# Patient Record
Sex: Female | Born: 2005
Health system: Southern US, Community
[De-identification: ages and names within clinical notes are randomized; demographics above are authoritative.]

## PROBLEM LIST (undated history)

## (undated) DIAGNOSIS — R569 Unspecified convulsions: Secondary | ICD-10-CM

---
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
## 2023-01-15 NOTE — Progress Notes (Signed)
  Victoria [name] [address] [name] Phone: [phone] Subjective:   Victoria [name], am serving as a scribe for Dr. Hulan Saas.  I'm seeing this patient by the request  of:  Chesley Noon, MD  CC: Back pain and headache follow-up  [id number]  Victoria [name] is a 17 y.o. female coming in with complaint of back and neck pain. OMT on 12/04/2022. Patient states doing well. Tension migraines are getting better. Everything else is the same. No new concerns.  Medications patient has been prescribed:   Taking:         Reviewed prior external information including notes and imaging from previsou exam, outside providers and external EMR if available.   As well as notes that were available from care everywhere and other healthcare systems.  Past medical history, social, surgical and family history all reviewed in electronic medical record.  No pertanent information unless stated regarding to the chief complaint.   Past Medical History:  Diagnosis Date   Seizures ([place])     Allergies  Allergen Reactions   Effexor Xr [Venlafaxine Hcl] Anxiety    Began with increased agitation and anxiety after two doses.      Review of Systems:  No  visual changes, nausea, vomiting, diarrhea, constipation, dizziness, abdominal pain, skin rash, fevers, chills, night sweats, weight loss, swollen lymph nodes, body aches, joint swelling, chest pain, shortness of breath, mood changes. POSITIVE muscle aches, headache  Objective  Blood pressure 110/62, pulse 100, height 5\' 5"  (1.651 m), weight 117 lb (53.1 kg), SpO2 98 %.   General: No apparent distress alert and oriented x3 mood and affect normal, dressed appropriately.  HEENT: Pupils equal, extraocular movements intact  Respiratory: Patient's speak in full sentences and does not appear short of breath  Cardiovascular: No lower extremity edema, non tender, no erythema  Neck exam does have some  mild loss of lordosis.  Some tenderness to palpation in the paraspinal musculature.  Tightness with sidebending bilaterally.  Very mild scoliosis noted.  Osteopathic findings  C2 flexed rotated and side bent right C7 flexed rotated and side bent left T7 extended rotated and side bent left L1 flexed rotated and side bent right Sacrum right on right       Assessment and Plan:  Scoliosis Overall doing very well.  Has had some more tension headaches recently.  This is doing better.  Discussed posture and ergonomics, discussed which activities to do History of 1.  Increase activity slowly.  Follow-up again in 6 to 8 weeks    Nonallopathic problems  Decision today to treat with OMT was based on Physical Exam  After verbal consent patient was treated with HVLA, ME, FPR techniques in cervical, thoracic, lumbar, and sacral  areas  Patient tolerated the procedure well with improvement in symptoms  Patient given exercises, stretches and lifestyle modifications  See medications in patient instructions if given  Patient will follow up in 4-8 weeks    The above documentation has been reviewed and is accurate and complete Lyndal Pulley, DO          Note: This dictation was prepared with Dragon dictation along with smaller phrase technology. Any transcriptional errors that result from this process are unintentional.

## 2023-01-22 ENCOUNTER — Ambulatory Visit (INDEPENDENT_AMBULATORY_CARE_PROVIDER_SITE_OTHER): Payer: 59 | Admitting: Family Medicine

## 2023-01-22 ENCOUNTER — Encounter: Payer: Self-pay | Admitting: Family Medicine

## 2023-01-22 VITALS — BP 110/62 | HR 100 | Ht 65.0 in | Wt 117.0 lb

## 2023-01-22 DIAGNOSIS — M4126 Other idiopathic scoliosis, lumbar region: Secondary | ICD-10-CM | POA: Diagnosis not present

## 2023-01-22 DIAGNOSIS — M9901 Segmental and somatic dysfunction of cervical region: Secondary | ICD-10-CM | POA: Diagnosis not present

## 2023-01-22 DIAGNOSIS — M9904 Segmental and somatic dysfunction of sacral region: Secondary | ICD-10-CM

## 2023-01-22 DIAGNOSIS — M9902 Segmental and somatic dysfunction of thoracic region: Secondary | ICD-10-CM

## 2023-01-22 DIAGNOSIS — M9903 Segmental and somatic dysfunction of lumbar region: Secondary | ICD-10-CM

## 2023-01-22 NOTE — Assessment & Plan Note (Signed)
Overall doing very well.  Has had some more tension headaches recently.  This is doing better.  Discussed posture and ergonomics, discussed which activities to do History of 1.  Increase activity slowly.  Follow-up again in 6 to 8 weeks

## 2023-01-22 NOTE — Patient Instructions (Signed)
Good to see you! Can try Voltaren gel topically See you again in 2 months

## 2023-03-27 ENCOUNTER — Ambulatory Visit: Payer: 59 | Admitting: Family Medicine

## 2023-04-04 NOTE — Progress Notes (Signed)
  [name] [address] [place] [address] Phone: [phone] Subjective:   Bruce Donath, am serving as a scribe for Dr. Antoine Primas.  I'm seeing this patient by the request  of:  Eartha Inch, MD  CC: neck and back pain   [id number]  Victoria [name] is a 17 y.o. female coming in with complaint of back and neck pain. OMT on 01/22/2023. Patient states that she is doing well. No issues since last visit.  Recently was [place] a amusement park and had no significant difficulty.         Reviewed prior external information including notes and imaging from previsou exam, outside providers and external EMR if available.   As well as notes that were available from care everywhere and other healthcare systems.  Past medical history, social, surgical and family history all reviewed in electronic medical record.  No pertanent information unless stated regarding to the chief complaint.   Past Medical History:  Diagnosis Date   Seizures (HCC)     Allergies  Allergen Reactions   Effexor Xr [Venlafaxine Hcl] Anxiety    Began with increased agitation and anxiety after two doses.      Review of Systems:  No headache, visual changes, nausea, vomiting, diarrhea, constipation, dizziness, abdominal pain, skin rash, fevers, chills, night sweats, weight loss, swollen lymph nodes, body aches, joint swelling, chest pain, shortness of breath, mood changes. POSITIVE muscle aches  Objective  Blood pressure 118/72, pulse (!) 116, height 5\' 5"  (1.651 m), weight 113 lb (51.3 kg), SpO2 98 %.   General: No apparent distress alert and oriented x3 mood and affect normal, dressed appropriately.  HEENT: Pupils equal, extraocular movements intact  Respiratory: Patient's speak in full sentences and does not appear short of breath  Cardiovascular: No lower extremity edema, non tender, no erythema  Mild scoliosis noted.  Nontender on exam low.  Good range of  motion.  Actually some mild hypermobility noted.  Osteopathic findings  C5 flexed rotated and side bent left T3 extended rotated and side bent right inhaled rib T5 extended rotated and side bent left L1 flexed rotated and side bent right L5 flexed rotated and side bent left Sacrum right on right    Assessment and Plan:  Scoliosis Responding extremely well though to osteopathic manipulation.  The patient now has significant Tanner stage V and the scoliosis will not change any more.  Feeling good at the moment and not carrying any of the heavy bags.  Will be going to college locally.  Discussed some other core strengthening and muscle strengthening noted.  Follow-up again in 12 weeks    Nonallopathic problems  Decision today to treat with OMT was based on Physical Exam  After verbal consent patient was treated with HVLA, ME, FPR techniques in cervical, rib, thoracic, lumbar, and sacral  areas  Patient tolerated the procedure well with improvement in symptoms  Patient given exercises, stretches and lifestyle modifications  See medications in patient instructions if given  Patient will follow up in 12weeks     The above documentation has been reviewed and is accurate and complete Judi Saa, DO         Note: This dictation was prepared with Dragon dictation along with smaller phrase technology. Any transcriptional errors that result from this process are unintentional.

## 2023-04-05 ENCOUNTER — Encounter: Payer: Self-pay | Admitting: Family Medicine

## 2023-04-05 ENCOUNTER — Ambulatory Visit (INDEPENDENT_AMBULATORY_CARE_PROVIDER_SITE_OTHER): Payer: 59 | Admitting: Family Medicine

## 2023-04-05 VITALS — BP 118/72 | HR 116 | Ht 65.0 in | Wt 113.0 lb

## 2023-04-05 DIAGNOSIS — M9903 Segmental and somatic dysfunction of lumbar region: Secondary | ICD-10-CM | POA: Diagnosis not present

## 2023-04-05 DIAGNOSIS — M4126 Other idiopathic scoliosis, lumbar region: Secondary | ICD-10-CM | POA: Diagnosis not present

## 2023-04-05 DIAGNOSIS — M9904 Segmental and somatic dysfunction of sacral region: Secondary | ICD-10-CM | POA: Diagnosis not present

## 2023-04-05 DIAGNOSIS — M9908 Segmental and somatic dysfunction of rib cage: Secondary | ICD-10-CM

## 2023-04-05 DIAGNOSIS — M9901 Segmental and somatic dysfunction of cervical region: Secondary | ICD-10-CM

## 2023-04-05 DIAGNOSIS — M9902 Segmental and somatic dysfunction of thoracic region: Secondary | ICD-10-CM | POA: Diagnosis not present

## 2023-04-05 NOTE — Assessment & Plan Note (Signed)
Responding extremely well though to osteopathic manipulation.  The patient now has significant Tanner stage V and the scoliosis will not change any more.  Feeling good at the moment and not carrying any of the heavy bags.  Will be going to college locally.  Discussed some other core strengthening and muscle strengthening noted.  Follow-up again in 12 weeks

## 2023-04-05 NOTE — Patient Instructions (Signed)
Good to see you Glad roller coaster didn't do too much damage See me in 2 months

## 2023-05-30 NOTE — Progress Notes (Signed)
  [name] [address] [place] [address] Phone: [phone] Subjective:   Victoria [name], am serving as a scribe for Dr. Antoine Primas.  I'm seeing this patient by the request  of:  Eartha Inch, MD  CC: Back and neck pain  [id number]  Victoria [name] is a 17 y.o. female coming in with complaint of back and neck pain. OMT on 04/05/2023. Patient states doing well. Having some headaches lately. Same per usual. No other concerns.        Reviewed prior external information including notes and imaging from previsou exam, outside providers and external EMR if available.   As well as notes that were available from care everywhere and other healthcare systems.  Past medical history, social, surgical and family history all reviewed in electronic medical record.  No pertanent information unless stated regarding to the chief complaint.   Past Medical History:  Diagnosis Date   Seizures (HCC)     Allergies  Allergen Reactions   Effexor Xr [Venlafaxine Hcl] Anxiety    Began with increased agitation and anxiety after two doses.     Review of Systems:  No headache, visual changes, nausea, vomiting, diarrhea, constipation, dizziness, abdominal pain, skin rash, fevers, chills, night sweats, weight loss, swollen lymph nodes, body aches, joint swelling, chest pain, shortness of breath, mood changes. POSITIVE muscle aches  Objective  Blood pressure 110/64, pulse (!) 113, height 5\' 5"  (1.651 m), weight 113 lb (51.3 kg), SpO2 99%.   General: No apparent distress alert and oriented x3 mood and affect normal, dressed appropriately.  HEENT: Pupils equal, extraocular movements intact  Respiratory: Patient's speak in full sentences and does not appear short of breath  Cardiovascular: No lower extremity edema, non tender, no erythema  Low back exam shows patient does have some mild loss of lordosis.  Patient does have tightness noted in the paraspinal  musculature.  Osteopathic findings  C5 flexed rotated and side bent right T5 extended rotated and side bent right inhaled rib T7 extended rotated and side bent left L1 flexed rotated and side bent left  Sacrum right on right     Assessment and Plan:  Scoliosis Patient does have a Tanner stage V now at this time.  No significant other changes.  Patient is going to college next week.  Patient knows can follow-up with me again in 2 to 3 months if it is helpful    Nonallopathic problems  Decision today to treat with OMT was based on Physical Exam  After verbal consent patient was treated with HVLA, ME, FPR techniques in cervical, rib, thoracic, lumbar, and sacral  areas  Patient tolerated the procedure well with improvement in symptoms  Patient given exercises, stretches and lifestyle modifications  See medications in patient instructions if given  Patient will follow up in 4-8 weeks    The above documentation has been reviewed and is accurate and complete Judi Saa, DO          Note: This dictation was prepared with Dragon dictation along with smaller phrase technology. Any transcriptional errors that result from this process are unintentional.

## 2023-05-31 ENCOUNTER — Ambulatory Visit: Payer: 59 | Admitting: Family Medicine

## 2023-05-31 VITALS — BP 110/64 | HR 113 | Ht 65.0 in | Wt 113.0 lb

## 2023-05-31 DIAGNOSIS — M9901 Segmental and somatic dysfunction of cervical region: Secondary | ICD-10-CM

## 2023-05-31 DIAGNOSIS — M9908 Segmental and somatic dysfunction of rib cage: Secondary | ICD-10-CM

## 2023-05-31 DIAGNOSIS — M9903 Segmental and somatic dysfunction of lumbar region: Secondary | ICD-10-CM | POA: Diagnosis not present

## 2023-05-31 DIAGNOSIS — M9902 Segmental and somatic dysfunction of thoracic region: Secondary | ICD-10-CM

## 2023-05-31 DIAGNOSIS — M4126 Other idiopathic scoliosis, lumbar region: Secondary | ICD-10-CM | POA: Diagnosis not present

## 2023-05-31 DIAGNOSIS — M9904 Segmental and somatic dysfunction of sacral region: Secondary | ICD-10-CM

## 2023-05-31 NOTE — Assessment & Plan Note (Signed)
Patient does have a Tanner stage V now at this time.  No significant other changes.  Patient is going to college next week.  Patient knows can follow-up with me again in 2 to 3 months if it is helpful

## 2024-03-03 ENCOUNTER — Other Ambulatory Visit (HOSPITAL_BASED_OUTPATIENT_CLINIC_OR_DEPARTMENT_OTHER): Payer: Self-pay

## 2024-03-03 MED ORDER — FLUCONAZOLE 150 MG PO TABS
ORAL_TABLET | ORAL | 0 refills | Status: DC
  Filled 2024-03-03: qty 1, 1d supply, fill #0

## 2024-03-03 MED ORDER — AMOXICILLIN 875 MG PO TABS
875.0000 mg | ORAL_TABLET | Freq: Two times a day (BID) | ORAL | 0 refills | Status: DC
  Filled 2024-03-03: qty 20, 10d supply, fill #0

## 2024-03-06 ENCOUNTER — Other Ambulatory Visit (HOSPITAL_BASED_OUTPATIENT_CLINIC_OR_DEPARTMENT_OTHER): Payer: Self-pay

## 2024-03-06 MED ORDER — AZITHROMYCIN 250 MG PO TABS
ORAL_TABLET | ORAL | 0 refills | Status: DC
  Filled 2024-03-06: qty 6, 5d supply, fill #0

## 2024-03-06 MED ORDER — LIDOCAINE VISCOUS HCL 2 % MT SOLN
15.0000 mL | Freq: Four times a day (QID) | OROMUCOSAL | 0 refills | Status: AC | PRN
  Filled 2024-03-06: qty 180, 3d supply, fill #0

## 2024-03-07 ENCOUNTER — Emergency Department (HOSPITAL_COMMUNITY)

## 2024-03-07 ENCOUNTER — Encounter (HOSPITAL_COMMUNITY): Payer: Self-pay

## 2024-03-07 ENCOUNTER — Observation Stay (HOSPITAL_COMMUNITY)
Admission: EM | Admit: 2024-03-07 | Discharge: 2024-03-09 | Disposition: A | Discharge status: Home / Self Care | Attending: Internal Medicine | Admitting: Internal Medicine

## 2024-03-07 ENCOUNTER — Other Ambulatory Visit: Payer: Self-pay

## 2024-03-07 DIAGNOSIS — Z0389 Encounter for observation for other suspected diseases and conditions ruled out: Secondary | ICD-10-CM | POA: Diagnosis not present

## 2024-03-07 DIAGNOSIS — R7401 Elevation of levels of liver transaminase levels: Secondary | ICD-10-CM | POA: Diagnosis not present

## 2024-03-07 DIAGNOSIS — Z114 Encounter for screening for human immunodeficiency virus [HIV]: Secondary | ICD-10-CM | POA: Diagnosis not present

## 2024-03-07 DIAGNOSIS — R7989 Other specified abnormal findings of blood chemistry: Secondary | ICD-10-CM | POA: Diagnosis not present

## 2024-03-07 DIAGNOSIS — R140 Abdominal distension (gaseous): Secondary | ICD-10-CM | POA: Diagnosis not present

## 2024-03-07 DIAGNOSIS — R799 Abnormal finding of blood chemistry, unspecified: Secondary | ICD-10-CM | POA: Diagnosis present

## 2024-03-07 DIAGNOSIS — B2790 Infectious mononucleosis, unspecified without complication: Secondary | ICD-10-CM | POA: Diagnosis not present

## 2024-03-07 DIAGNOSIS — R000 Tachycardia, unspecified: Secondary | ICD-10-CM | POA: Diagnosis not present

## 2024-03-07 DIAGNOSIS — J020 Streptococcal pharyngitis: Secondary | ICD-10-CM | POA: Diagnosis not present

## 2024-03-07 DIAGNOSIS — B2799 Infectious mononucleosis, unspecified with other complication: Secondary | ICD-10-CM

## 2024-03-07 DIAGNOSIS — D72829 Elevated white blood cell count, unspecified: Secondary | ICD-10-CM | POA: Diagnosis not present

## 2024-03-07 LAB — CBC WITH DIFFERENTIAL/PLATELET
Abs Immature Granulocytes: 0 10*3/uL (ref 0.00–0.07)
Basophils Absolute: 0 10*3/uL (ref 0.0–0.1)
Basophils Relative: 0 %
Eosinophils Absolute: 0 10*3/uL (ref 0.0–0.5)
Eosinophils Relative: 0 %
HCT: 35.7 % — ABNORMAL LOW (ref 36.0–46.0)
Hemoglobin: 11.9 g/dL — ABNORMAL LOW (ref 12.0–15.0)
Lymphocytes Relative: 38 %
Lymphs Abs: 7 10*3/uL — ABNORMAL HIGH (ref 0.7–4.0)
MCH: 30.1 pg (ref 26.0–34.0)
MCHC: 33.3 g/dL (ref 30.0–36.0)
MCV: 90.2 fL (ref 80.0–100.0)
Monocytes Absolute: 3.3 10*3/uL — ABNORMAL HIGH (ref 0.1–1.0)
Monocytes Relative: 18 %
Neutro Abs: 8.1 10*3/uL — ABNORMAL HIGH (ref 1.7–7.7)
Neutrophils Relative %: 44 %
Platelets: 209 10*3/uL (ref 150–400)
RBC: 3.96 MIL/uL (ref 3.87–5.11)
RDW: 12.2 % (ref 11.5–15.5)
WBC: 18.3 10*3/uL — ABNORMAL HIGH (ref 4.0–10.5)
nRBC: 0 % (ref 0.0–0.2)
nRBC: 0 /100{WBCs}

## 2024-03-07 LAB — COMPREHENSIVE METABOLIC PANEL WITH GFR
ALT: 530 U/L — ABNORMAL HIGH (ref 0–44)
AST: 364 U/L — ABNORMAL HIGH (ref 15–41)
Albumin: 3.6 g/dL (ref 3.5–5.0)
Alkaline Phosphatase: 192 U/L — ABNORMAL HIGH (ref 38–126)
Anion gap: 10 (ref 5–15)
BUN: 10 mg/dL (ref 6–20)
CO2: 23 mmol/L (ref 22–32)
Calcium: 8.8 mg/dL — ABNORMAL LOW (ref 8.9–10.3)
Chloride: 103 mmol/L (ref 98–111)
Creatinine, Ser: 0.85 mg/dL (ref 0.44–1.00)
GFR, Estimated: >60 mL/min (ref >60)
Glucose, Bld: 99 mg/dL (ref 70–99)
Potassium: 3.4 mmol/L — ABNORMAL LOW (ref 3.5–5.1)
Sodium: 136 mmol/L (ref 135–145)
Total Bilirubin: 0.8 mg/dL (ref 0.0–1.2)
Total Protein: 7.3 g/dL (ref 6.5–8.1)

## 2024-03-07 LAB — HCG, SERUM, QUALITATIVE: Preg, Serum: NEGATIVE

## 2024-03-07 LAB — URINALYSIS, W/ REFLEX TO CULTURE (INFECTION SUSPECTED)
Bilirubin Urine: NEGATIVE
Glucose, UA: NEGATIVE mg/dL
Hgb urine dipstick: NEGATIVE
Ketones, ur: 5 mg/dL — AB
Leukocytes,Ua: NEGATIVE
Nitrite: NEGATIVE
Protein, ur: NEGATIVE mg/dL
Specific Gravity, Urine: 1.021 (ref 1.005–1.030)
pH: 5 (ref 5.0–8.0)

## 2024-03-07 LAB — PROTIME-INR
INR: 1.2 (ref 0.8–1.2)
Prothrombin Time: 15.4 s — ABNORMAL HIGH (ref 11.4–15.2)

## 2024-03-07 LAB — I-STAT CG4 LACTIC ACID, ED
Lactic Acid, Venous: 1.2 mmol/L (ref 0.5–1.9)
Lactic Acid, Venous: 1.4 mmol/L (ref 0.5–1.9)

## 2024-03-07 LAB — MONONUCLEOSIS SCREEN: Mono Screen: POSITIVE — AB

## 2024-03-07 MED ORDER — LACTATED RINGERS IV BOLUS
1000.0000 mL | Freq: Once | INTRAVENOUS | Status: AC
  Administered 2024-03-07: 1000 mL via INTRAVENOUS

## 2024-03-07 MED ORDER — ACETAMINOPHEN 325 MG PO TABS
650.0000 mg | ORAL_TABLET | Freq: Four times a day (QID) | ORAL | Status: DC | PRN
  Administered 2024-03-07 – 2024-03-08 (×5): 650 mg via ORAL
  Filled 2024-03-07 (×5): qty 2

## 2024-03-07 MED ORDER — ACETAMINOPHEN 500 MG PO TABS: 1000.0000 mg | ORAL_TABLET | Freq: Once | ORAL | Status: DC

## 2024-03-07 MED ORDER — LACTATED RINGERS IV SOLN: INTRAVENOUS | Status: AC

## 2024-03-07 MED ORDER — SODIUM CHLORIDE 0.9 % IV SOLN
1.0000 g | Freq: Once | INTRAVENOUS | Status: AC
  Administered 2024-03-07: 1 g via INTRAVENOUS
  Filled 2024-03-07: qty 10

## 2024-03-07 MED ORDER — IBUPROFEN 400 MG PO TABS
600.0000 mg | ORAL_TABLET | Freq: Once | ORAL | Status: DC
  Filled 2024-03-07: qty 1

## 2024-03-07 MED ORDER — IBUPROFEN 200 MG PO TABS
400.0000 mg | ORAL_TABLET | Freq: Four times a day (QID) | ORAL | Status: DC | PRN
  Filled 2024-03-07: qty 2

## 2024-03-07 NOTE — ED Provider Notes (Signed)
 [facility] Emergency Department Provider Note MRN:  [id number]  Arrival date & time: 03/07/24     Chief Complaint   Abnormal Lab   History of Present Illness   Victoria [name] is a 18 y.o. year-old female presents to the ED with chief complaint of abnormal lab.  She states that she received a phone call this morning at 3 AM notifying that her white blood cell count was 29.  She was recently diagnosed with strep throat earlier this week.  She was put on amoxicillin , but did not improve.  She was seen at urgent care and given a shot of Rocephin and discharged home on a Zithromax .  She has not started the Z-Pak yet.  She states that she still feels poorly.  She states that she has had some slight cough.  She denies any new symptoms.  Denies any rash.  History provided by patient.   Review of Systems  Pertinent positive and negative review of systems noted in HPI.    Physical Exam   Vitals:   03/07/24 0546 03/07/24 0600  BP: 134/85 115/79  Pulse: (!) 103 97  Resp: 17 17  Temp: 99.2 F (37.3 C)   SpO2: 99% 98%    CONSTITUTIONAL:  dry-appearing, NAD NEURO:  Alert and oriented x 3, CN 3-12 grossly intact EYES:  eyes equal and reactive ENT/NECK:  Supple, no stridor, oropharynx is erythematous, no abscess CARDIO:  tachycardic, regular rhythm, appears well-perfused  PULM:  No respiratory distress, CTAB GI/GU:  non-distended, no focal tenderness MSK/SPINE:  No gross deformities, no edema, moves all extremities  SKIN:  no rash, atraumatic   *Additional and/or pertinent findings included in MDM below  Diagnostic and Interventional Summary    EKG Interpretation Date/Time:    Ventricular Rate:    PR Interval:    QRS Duration:    QT Interval:    QTC Calculation:   R Axis:      Text Interpretation:         Labs Reviewed  CBC WITH DIFFERENTIAL/PLATELET - Abnormal; Notable for the following components:      Result Value   WBC 18.3 (*)     Hemoglobin 11.9 (*)    HCT 35.7 (*)    Neutro Abs 8.1 (*)    Lymphs Abs 7.0 (*)    Monocytes Absolute 3.3 (*)    All other components within normal limits  MONONUCLEOSIS SCREEN - Abnormal; Notable for the following components:   Mono Screen POSITIVE (*)    All other components within normal limits  COMPREHENSIVE METABOLIC PANEL WITH GFR - Abnormal; Notable for the following components:   Potassium 3.4 (*)    Calcium 8.8 (*)    AST 364 (*)    ALT 530 (*)    Alkaline Phosphatase 192 (*)    All other components within normal limits  PROTIME-INR - Abnormal; Notable for the following components:   Prothrombin Time 15.4 (*)    All other components within normal limits  URINALYSIS, W/ REFLEX TO CULTURE (INFECTION SUSPECTED) - Abnormal; Notable for the following components:   Color, Urine AMBER (*)    APPearance HAZY (*)    Ketones, ur 5 (*)    Bacteria, UA RARE (*)    All other components within normal limits  CULTURE, BLOOD (ROUTINE X 2)  CULTURE, BLOOD (ROUTINE X 2)  HCG, SERUM, QUALITATIVE  I-STAT CG4 LACTIC ACID, ED  I-STAT CG4 LACTIC ACID, ED    DG Chest [facility]  1 View  Final Result      Medications  lactated ringers bolus 1,000 mL (0 mLs Intravenous Stopped 03/07/24 1308)     Procedures  /  Critical Care Procedures  ED Course and Medical Decision Making  I have reviewed the triage vital signs, the nursing notes, and pertinent available records from the EMR.  Social Determinants Affecting Complexity of Care: Patient has no clinically significant social determinants affecting this chief complaint..   ED Course:    Medical Decision Making Patient here after mom received a phone call about elevated white blood cell count early this morning.  Was reportedly 29.  Patient had been diagnosed with strep throat earlier this week and had been taking amoxicillin .  Was seen at urgent care yesterday and had labs done and was referred to the emergency department this  morning.  Repeat CBC is notable for leukocytosis to 18.3, down from 29.  I added a mononucleosis screen, which was positive.  Additionally, patient has elevated LFTs.  After discussion with Dr. Harless Lien, we discussed case with hospitalist, Dr. Del Favia, who recommends trending labs and continuing with hydration.  Appreciate Dr. Del Favia for observation admission.  Amount and/or Complexity of Data Reviewed Labs: ordered. Radiology: ordered.  Risk Decision regarding hospitalization.         Consultants: I consulted with Hospitalist, Dr. Del Favia, who is appreciated for admitting.   Treatment and Plan: Patient's exam and diagnostic results are concerning for +mono and +strep with elevated LFTs and meeting SIRS criteria.  Feel that patient will need admission to the hospital for further treatment and evaluation.    Final Clinical Impressions(s) / ED Diagnoses     ICD-10-CM   1. Strep pharyngitis  J02.0     2. Infectious mononucleosis, with other complication, infectious mononucleosis due to unspecified organism  B27.99     3. Elevated LFTs  R79.89       ED Discharge Orders     None         Discharge Instructions Discussed with and Provided to Patient:   Discharge Instructions   None      Sherel Dikes, PA-C 03/07/24 6578    Edson Graces, MD 03/07/24 [phone]

## 2024-03-07 NOTE — ED Notes (Signed)
 Called  2W  called aware the patient is coming .

## 2024-03-07 NOTE — ED Triage Notes (Signed)
 Pt arrived POV from home c/o of an abnormal lab stating her WBC 29.9. Pt was diagnosed with strep throat on Tuesday and given ampicillin but did not get better returned to the doctor yesterday and had lab work done and given a shot of rocephin. Pt's mother was called at 3am this morning and told her WBC was 29.9 and needed to get to an ED asap.

## 2024-03-07 NOTE — ED Notes (Signed)
Right AC 

## 2024-03-07 NOTE — H&P (Addendum)
 History and Physical    Patient: Victoria [name]:[id number] DOB: 01-18-2006 DOA: 03/07/2024 DOS: the patient was seen and examined on 03/07/2024 PCP: Jewell Mose, NP  Patient coming from: Home  Chief Complaint:  Chief Complaint  Patient presents with   Abnormal Lab   HPI: Victoria [name] is a 18 y.o. female with medical history significant of sz p/w sore throat and fatigue for weeks.   Pt states that she was in her USOH until 2 weeks ago when she began to experience profound fatigue that was inexplicable. She was preparing for exam at [facility] [employer] [facility] and initially attributed the fatigue to the studying; however, last week her sx progressed to include a sore throat and at the behest of her father, who saw white patches and fire engine red tonsils, she presented to her PCP and was diagnosed with strep throat and prescribed amoxicillin  BID for 10 days. Despite taking the abx for 3 days, her pain and fatigue did not improve; as such, she re-presented to her PCP for further testing and labs demonstrated leukocytosis; as such, she was advised to present to the nearest ED.   In the ED, the patient was  afebrile and tachycardic. Labs positive for K 3.4, Cr 0.85, WBC 18.3, AST/ALT 364/530, lactate 1.2, and Monos screen was positive. Pt admitted for ongoing care iso leukocytosis and dehydration from mononucleosis.  Review of Systems: As mentioned in the history of present illness. All other systems reviewed and are negative. Past Medical History:  Diagnosis Date   Seizures (HCC)    History reviewed. No pertinent surgical history. Social History:  reports that she has never smoked. She has never used smokeless tobacco. No history on file for alcohol use and drug use.  Allergies  Allergen Reactions   Caffeine Other (See Comments)    Sensitivity   Effexor  Xr [Venlafaxine  Hcl] Anxiety    Began with increased agitation and anxiety after two doses.     Family History  Problem Relation Age of Onset    GER disease Mother     Prior to Admission medications   Medication Sig Start Date End Date Taking? Authorizing Provider  acetaminophen (TYLENOL) 500 MG tablet Take 500-1,000 mg by mouth every 6 (six) hours as needed for moderate pain (pain score 4-6).   Yes [provider]  calcium carbonate (TUMS - DOSED IN MG ELEMENTAL CALCIUM) 500 MG chewable tablet Chew 1 tablet by mouth as needed for indigestion or heartburn.   Yes [provider]  famotidine (PEPCID) 20 MG tablet Take 20 mg by mouth 2 (two) times daily as needed for heartburn or indigestion.   Yes [provider]  FLUoxetine (PROZAC) 10 MG capsule Take 10 mg by mouth daily. 02/05/24  Yes [provider]  loratadine (CLARITIN) 10 MG tablet Take 10 mg by mouth daily as needed for allergies.   Yes [provider]  Multiple Minerals-Vitamins (GNP CAL MAG ZINC +D3 PO) Take 1 tablet by mouth daily as needed (Supplement).   Yes [provider]  Probiotic Product (PROBIOTIC PO) Take 1 Dose by mouth daily as needed (With antibiotics).   Yes [provider]  simethicone (MYLICON) 125 MG chewable tablet Chew 125 mg by mouth every 6 (six) hours as needed for flatulence.   Yes [provider]  amoxicillin  (AMOXIL ) 875 MG tablet Take 1 tablet (875 mg total) by mouth 2 (two) times daily for 10 days. 03/03/24 03/13/24 Yes   azithromycin  (ZITHROMAX ) 250 MG tablet Take 2  tablets by mouth daily on day 1, then 1 tablet daily on days 2-5. Patient not taking: Reported on 03/07/2024 03/06/24     fluconazole  (DIFLUCAN ) 150 MG tablet Take 1 tablet (150 mg dose) by mouth once for 1 dose. Patient not taking: Reported on 03/07/2024 03/03/24     hydrOXYzine  (ATARAX ) 25 MG tablet Take 1 tablet (25 mg total) by mouth every 6 (six) hours as needed for anxiety. 04/14/22  Yes Garen Juneau, NP  magic mouthwash (lidocaine , diphenhydrAMINE, alum & mag hydroxide) suspension Swish and spit 15 mLs by mouth every  6 (six) hours as needed. 03/06/24  Yes Jewell Mose, NP    Physical Exam: Vitals:   03/07/24 0410 03/07/24 0546 03/07/24 0600 03/07/24 0848  BP:  134/85 115/79 116/78  Pulse:  (!) 103 97 94  Resp:  17 17 16   Temp: 98.2 F (36.8 C) 99.2 F (37.3 C)  100 F (37.8 C)  TempSrc: Oral Oral  Oral  SpO2:  99% 98% 98%  Weight:      Height:       General: Alert, oriented x3, resting comfortably in no acute distress Respiratory: Lungs clear to auscultation bilaterally with normal respiratory effort; no w/r/r Cardiovascular: Regular rate and rhythm w/o m/r/g  Data Reviewed:  Lab Results  Component Value Date   WBC 18.3 (H) 03/07/2024   HGB 11.9 (L) 03/07/2024   HCT 35.7 (L) 03/07/2024   MCV 90.2 03/07/2024   PLT 209 03/07/2024   Lab Results  Component Value Date   GLUCOSE 99 03/07/2024   CALCIUM 8.8 (L) 03/07/2024   NA 136 03/07/2024   K 3.4 (L) 03/07/2024   CO2 23 03/07/2024   CL 103 03/07/2024   BUN 10 03/07/2024   CREATININE 0.85 03/07/2024   Lab Results  Component Value Date   ALT 530 (H) 03/07/2024   AST 364 (H) 03/07/2024   ALKPHOS 192 (H) 03/07/2024   BILITOT 0.8 03/07/2024   Lab Results  Component Value Date   INR 1.2 03/07/2024    Radiology: [facility] Chest Port 1 View Result Date: 03/07/2024 CLINICAL DATA:  18 year old female with possible sepsis. EXAM: PORTABLE CHEST 1 VIEW COMPARISON:  Chest and abdominal radiographs 12/12/2013 and earlier. FINDINGS: Portable AP view at 0544 hours. Mild to moderate dextroconvex scoliosis is new since 2015. Normal lung volumes and mediastinal contours. Visualized tracheal air column is within normal limits. Allowing for portable technique the lungs are clear. No pneumothorax or pleural effusion. Multiple EKG leads and wires. No acute osseous abnormality identified. Paucity of bowel gas. IMPRESSION: Negative portable chest;  dextroconvex scoliosis. Electronically Signed   By: Marlise Simpers M.D.   On: 03/07/2024 06:15     Assessment  and Plan: 4F h/o sz p/w mononucleosis and strep throat.  Mononucleosis Strep throat Pt reports a positive strep throat screen at PCP on 5/13. Mono Screen positive at ED on 5/17. -IV CTX 1g x1 on 5/17; resume pta amoxicillin  for strep throat at discharge -Continue supplemental care for mononucleosis -PO ibuprofen prn for throat pain and fever -MIVF: NS at 75ml/h for 1 day -Pt advised to avoid contact sports  Transminitis Related to above -CTM   Advance Care Planning:   Code Status: Full Code   Consults: N/A  Family Communication: Mother and father updated at bedside.  Severity of Illness: The appropriate patient status for this patient is OBSERVATION. Observation status is judged to be reasonable and necessary in order to provide the required intensity of service to  ensure the patient's safety. The patient's presenting symptoms, physical exam findings, and initial radiographic and laboratory data in the context of their medical condition is felt to place them at decreased risk for further clinical deterioration. Furthermore, it is anticipated that the patient will be medically stable for discharge from the hospital within 2 midnights of admission.    ------- I spent 55 minutes reviewing previous labs/notes, obtaining separate history at the bedside, counseling/discussing the treatment plan outlined above, ordering medications/tests, and performing clinical documentation.  Author: Arne Langdon, MD 03/07/2024 11:19 AM  For on call review [email or web address].

## 2024-03-07 NOTE — Plan of Care (Signed)

## 2024-03-08 DIAGNOSIS — R7989 Other specified abnormal findings of blood chemistry: Secondary | ICD-10-CM | POA: Diagnosis not present

## 2024-03-08 DIAGNOSIS — J020 Streptococcal pharyngitis: Secondary | ICD-10-CM | POA: Diagnosis not present

## 2024-03-08 DIAGNOSIS — B2799 Infectious mononucleosis, unspecified with other complication: Secondary | ICD-10-CM | POA: Diagnosis not present

## 2024-03-08 LAB — COMPREHENSIVE METABOLIC PANEL WITH GFR
ALT: 610 U/L — ABNORMAL HIGH (ref 0–44)
AST: 455 U/L — ABNORMAL HIGH (ref 15–41)
Albumin: 3.4 g/dL — ABNORMAL LOW (ref 3.5–5.0)
Alkaline Phosphatase: 189 U/L — ABNORMAL HIGH (ref 38–126)
Anion gap: 9 (ref 5–15)
BUN: 5 mg/dL — ABNORMAL LOW (ref 6–20)
CO2: 25 mmol/L (ref 22–32)
Calcium: 9.1 mg/dL (ref 8.9–10.3)
Chloride: 102 mmol/L (ref 98–111)
Creatinine, Ser: 0.74 mg/dL (ref 0.44–1.00)
GFR, Estimated: >60 mL/min (ref >60)
Glucose, Bld: 94 mg/dL (ref 70–99)
Potassium: 4.5 mmol/L (ref 3.5–5.1)
Sodium: 136 mmol/L (ref 135–145)
Total Bilirubin: 0.8 mg/dL (ref 0.0–1.2)
Total Protein: 6.8 g/dL (ref 6.5–8.1)

## 2024-03-08 LAB — CBC
HCT: 36 % (ref 36.0–46.0)
Hemoglobin: 11.8 g/dL — ABNORMAL LOW (ref 12.0–15.0)
MCH: 29.8 pg (ref 26.0–34.0)
MCHC: 32.8 g/dL (ref 30.0–36.0)
MCV: 90.9 fL (ref 80.0–100.0)
Platelets: 189 10*3/uL (ref 150–400)
RBC: 3.96 MIL/uL (ref 3.87–5.11)
RDW: 12.4 % (ref 11.5–15.5)
WBC: 20.7 10*3/uL — ABNORMAL HIGH (ref 4.0–10.5)
nRBC: 0 % (ref 0.0–0.2)

## 2024-03-08 LAB — PHOSPHORUS: Phosphorus: 3.7 mg/dL (ref 2.5–4.6)

## 2024-03-08 LAB — MAGNESIUM: Magnesium: 2.1 mg/dL (ref 1.7–2.4)

## 2024-03-08 MED ORDER — AMOXICILLIN 500 MG PO CAPS
1000.0000 mg | ORAL_CAPSULE | Freq: Two times a day (BID) | ORAL | Status: DC
  Administered 2024-03-08 – 2024-03-09 (×3): 1000 mg via ORAL
  Filled 2024-03-08 (×3): qty 2

## 2024-03-08 MED ORDER — PHENOL 1.4 % MT LIQD
1.0000 | OROMUCOSAL | Status: DC | PRN
  Administered 2024-03-08: 1 via OROMUCOSAL
  Filled 2024-03-08: qty 177

## 2024-03-08 MED ORDER — FLUOXETINE HCL 10 MG PO CAPS
10.0000 mg | ORAL_CAPSULE | Freq: Every day | ORAL | Status: DC
  Administered 2024-03-08: 10 mg via ORAL
  Filled 2024-03-08 (×2): qty 1

## 2024-03-08 MED ORDER — FAMOTIDINE 20 MG PO TABS: 20.0000 mg | ORAL_TABLET | Freq: Two times a day (BID) | ORAL | Status: DC | PRN

## 2024-03-08 MED ORDER — HYDROXYZINE HCL 25 MG PO TABS: 25.0000 mg | ORAL_TABLET | Freq: Four times a day (QID) | ORAL | Status: DC | PRN

## 2024-03-08 NOTE — Hospital Course (Signed)
 18 y.o. female with medical history significant of sz p/w sore throat and fatigue for weeks.    Pt states that she was in her USOH until 2 weeks ago when she began to experience profound fatigue that was inexplicable. She was preparing for exam at [employer] and initially attributed the fatigue to the studying; however, last week her sx progressed to include a sore throat and at the behest of her father, who saw white patches and fire engine red tonsils, she presented to her PCP and was diagnosed with strep throat and prescribed amoxicillin  BID for 10 days. Despite taking the abx for 3 days, her pain and fatigue did not improve; as such, she re-presented to her PCP for further testing and labs demonstrated leukocytosis; as such, she was advised to present to the nearest ED.    In the ED, the patient was  afebrile and tachycardic. Labs positive for K 3.4, Cr 0.85, WBC 18.3, AST/ALT 364/530, lactate 1.2, and Monos screen was positive. Pt admitted for ongoing care iso leukocytosis and dehydration from mononucleosis.

## 2024-03-08 NOTE — Plan of Care (Signed)

## 2024-03-08 NOTE — Progress Notes (Signed)
  Progress Note   Patient: Victoria [name] ZOX:[id number] DOB: 2005-11-23 DOA: 03/07/2024     0 DOS: the patient was seen and examined on 03/08/2024   Brief hospital course: 18 y.o. female with medical history significant of sz p/w sore throat and fatigue for weeks.    Pt states that she was in her USOH until 2 weeks ago when she began to experience profound fatigue that was inexplicable. She was preparing for exam at [facility] and initially attributed the fatigue to the studying; however, last week her sx progressed to include a sore throat and at the behest of her father, who saw white patches and fire engine red tonsils, she presented to her PCP and was diagnosed with strep throat and prescribed amoxicillin  BID for 10 days. Despite taking the abx for 3 days, her pain and fatigue did not improve; as such, she re-presented to her PCP for further testing and labs demonstrated leukocytosis; as such, she was advised to present to the nearest ED.    In the ED, the patient was  afebrile and tachycardic. Labs positive for K 3.4, Cr 0.85, WBC 18.3, AST/ALT 364/530, lactate 1.2, and Monos screen was positive. Pt admitted for ongoing care iso leukocytosis and dehydration from mononucleosis.  Assessment and Plan: Mononucleosis Strep throat Pt reports a positive strep throat screen at PCP on 5/13. Mono Screen positive at ED on 5/17. -IV CTX 1g x1 on 5/17; will continue resume pta amoxicillin  for strep throat -Continue supplemental care for mononucleosis -PO ibuprofen prn for throat pain and fever -Pt advised to avoid contact sports -Pt is feeling better today, however WBC is rising, up to 20k   Transminitis Related to above -LFT's are trending up -recheck LFT's in AM      Subjective: Feeling better today. Eager to go home soon  Physical Exam: Vitals:   03/07/24 1727 03/07/24 2025 03/08/24 0434 03/08/24 0811  BP: 99/62 123/86 109/67 116/67  Pulse: 72 97 73 76  Resp: 18 18 18 16   Temp: 97.9 F  (36.6 C) 98.6 F (37 C) 98.9 F (37.2 C) 98.2 F (36.8 C)  TempSrc: Oral Oral Oral Oral  SpO2: 99% 100% 100% 99%  Weight:      Height:       General exam: Awake, laying in bed, in nad Respiratory system: Normal respiratory effort, no wheezing Cardiovascular system: regular rate, s1, s2 Gastrointestinal system: Soft, nondistended, positive BS Central nervous system: CN2-12 grossly intact, strength intact Extremities: Perfused, no clubbing Skin: Normal skin turgor, no notable skin lesions seen Psychiatry: Mood normal // no visual hallucinations   Data Reviewed:  Labs reviewed: Na 136, K 4.5, Cr 0.74, WBC 20.7,Hgb 11.8  Family Communication: Pt in room, family at bedside  Disposition: Status is: Observation Expected discharge in <2 midnight  Planned Discharge Destination: Home    Author: Cherylle Corwin, MD 03/08/2024 4:04 PM  For on call review [email or web address].

## 2024-03-09 ENCOUNTER — Other Ambulatory Visit (HOSPITAL_COMMUNITY): Payer: Self-pay

## 2024-03-09 DIAGNOSIS — J020 Streptococcal pharyngitis: Secondary | ICD-10-CM | POA: Diagnosis not present

## 2024-03-09 DIAGNOSIS — R7989 Other specified abnormal findings of blood chemistry: Secondary | ICD-10-CM | POA: Diagnosis not present

## 2024-03-09 DIAGNOSIS — B2799 Infectious mononucleosis, unspecified with other complication: Secondary | ICD-10-CM | POA: Diagnosis not present

## 2024-03-09 LAB — CBC
HCT: 35.7 % — ABNORMAL LOW (ref 36.0–46.0)
Hemoglobin: 11.7 g/dL — ABNORMAL LOW (ref 12.0–15.0)
MCH: 29.6 pg (ref 26.0–34.0)
MCHC: 32.8 g/dL (ref 30.0–36.0)
MCV: 90.4 fL (ref 80.0–100.0)
Platelets: 189 10*3/uL (ref 150–400)
RBC: 3.95 MIL/uL (ref 3.87–5.11)
RDW: 12.4 % (ref 11.5–15.5)
WBC: 18.2 10*3/uL — ABNORMAL HIGH (ref 4.0–10.5)
nRBC: 0 % (ref 0.0–0.2)

## 2024-03-09 LAB — COMPREHENSIVE METABOLIC PANEL WITH GFR
ALT: 632 U/L — ABNORMAL HIGH (ref 0–44)
AST: 436 U/L — ABNORMAL HIGH (ref 15–41)
Albumin: 3.2 g/dL — ABNORMAL LOW (ref 3.5–5.0)
Alkaline Phosphatase: 197 U/L — ABNORMAL HIGH (ref 38–126)
Anion gap: 7 (ref 5–15)
BUN: 7 mg/dL (ref 6–20)
CO2: 26 mmol/L (ref 22–32)
Calcium: 8.8 mg/dL — ABNORMAL LOW (ref 8.9–10.3)
Chloride: 102 mmol/L (ref 98–111)
Creatinine, Ser: 0.76 mg/dL (ref 0.44–1.00)
GFR, Estimated: >60 mL/min (ref >60)
Glucose, Bld: 100 mg/dL — ABNORMAL HIGH (ref 70–99)
Potassium: 4.3 mmol/L (ref 3.5–5.1)
Sodium: 135 mmol/L (ref 135–145)
Total Bilirubin: 0.8 mg/dL (ref 0.0–1.2)
Total Protein: 6.7 g/dL (ref 6.5–8.1)

## 2024-03-09 MED ORDER — AMOXICILLIN 500 MG PO CAPS
1000.0000 mg | ORAL_CAPSULE | Freq: Two times a day (BID) | ORAL | 0 refills | Status: AC
  Filled 2024-03-09: qty 16, 4d supply, fill #0

## 2024-03-09 NOTE — Plan of Care (Signed)

## 2024-03-09 NOTE — Discharge Summary (Signed)
 Physician Discharge Summary   Patient: Victoria [name] MRN: [id number] DOB: 2006/04/27  Admit date:     03/07/2024  Discharge date: 03/09/24  Discharge Physician: Cherylle Corwin   PCP: Jewell Mose, NP   Recommendations at discharge:    Follow up with PCP in 1-2 weeks Please follow up with confirmatory HIV test. Discussed with Infectious Disease, who will follow as well Recommend repeat CMP in 1 week with focus on LFT's to ensure normalization  Discharge Diagnoses: Principal Problem:   Mononucleosis  Resolved Problems:   * No resolved hospital problems. *  Hospital Course: 18 y.o. female with medical history significant of sz p/w sore throat and fatigue for weeks.    Pt states that she was in her USOH until 2 weeks ago when she began to experience profound fatigue that was inexplicable. She was preparing for exam at [employer] [facility] [employer] [employer] [facility] and initially attributed the fatigue to the studying; however, last week her sx progressed to include a sore throat and at the behest of her father, who saw white patches and fire engine red tonsils, she presented to her PCP and was diagnosed with strep throat and prescribed amoxicillin  BID for 10 days. Despite taking the abx for 3 days, her pain and fatigue did not improve; as such, she re-presented to her PCP for further testing and labs demonstrated leukocytosis; as such, she was advised to present to the nearest ED.    In the ED, the patient was  afebrile and tachycardic. Labs positive for K 3.4, Cr 0.85, WBC 18.3, AST/ALT 364/530, lactate 1.2, and Monos screen was positive. Pt admitted for ongoing care iso leukocytosis and dehydration from mononucleosis.  Assessment and Plan: Mononucleosis Strep throat Pt reports a positive strep throat screen at PCP on 5/13. Mono Screen positive at ED on 5/17. -IV CTX 1g x1 on 5/17; continued pta amoxicillin  for strep throat -Continue supplemental care for mononucleosis -PO ibuprofen  prn for throat pain and fever -Pt  advised to avoid contact sports -Pt is feeling better. WBC peaked to 20k, but is now trending down   Transminitis Related to above -LFT's now trending down at time of d/c  Rapid HIV positive test -Confirmatory test is currently in progress -Pt is low risk for HIV. High suspicion for false pos test given low risk -Discussed with ID who will f/u on confirmatory test. Please f/u on test result    Consultants:  Procedures performed:   Disposition: Home Diet recommendation:  Regular diet DISCHARGE MEDICATION: Allergies as of 03/09/2024       Reactions   Caffeine Other (See Comments)   Sensitivity   Effexor  Xr [venlafaxine  Hcl] Anxiety   Began with increased agitation and anxiety after two doses.         Medication List     STOP taking these medications    amoxicillin  875 MG tablet Commonly known as: AMOXIL  Replaced by: amoxicillin  500 MG capsule   azithromycin  250 MG tablet Commonly known as: ZITHROMAX    fluconazole  150 MG tablet Commonly known as: DIFLUCAN        TAKE these medications    acetaminophen  500 MG tablet Commonly known as: TYLENOL  Take 500-1,000 mg by mouth every 6 (six) hours as needed for moderate pain (pain score 4-6).   amoxicillin  500 MG capsule Commonly known as: AMOXIL  Take 2 capsules (1,000 mg total) by mouth 2 (two) times daily for 4 days. Replaces: amoxicillin  875 MG tablet   calcium carbonate 500 MG chewable tablet Commonly known as: TUMS - dosed  in mg elemental calcium Chew 1 tablet by mouth as needed for indigestion or heartburn.   famotidine  20 MG tablet Commonly known as: PEPCID  Take 20 mg by mouth 2 (two) times daily as needed for heartburn or indigestion.   FLUoxetine  10 MG capsule Commonly known as: PROZAC  Take 10 mg by mouth daily.   GNP CAL MAG ZINC +D3 PO Take 1 tablet by mouth daily as needed (Supplement).   hydrOXYzine  25 MG tablet Commonly known as: ATARAX  Take 1 tablet (25 mg total) by mouth every 6 (six) hours  as needed for anxiety.   loratadine 10 MG tablet Commonly known as: CLARITIN Take 10 mg by mouth daily as needed for allergies.   magic mouthwash (lidocaine , diphenhydrAMINE, alum & mag hydroxide) suspension Swish and spit 15 mLs by mouth every 6 (six) hours as needed.   PROBIOTIC PO Take 1 Dose by mouth daily as needed (With antibiotics).   simethicone 125 MG chewable tablet Commonly known as: MYLICON Chew 125 mg by mouth every 6 (six) hours as needed for flatulence.        Follow-up Information     McClanahan, Kyra, NP Follow up in 2 week(s).   Specialty: Adult Health Nurse Practitioner Why: Hospital follow up Contact information: [address] [place] [address] [phone]         Jamesetta Mcbride, MD Follow up.   Specialty: Infectious Diseases Why: As needed Contact information: [address] [place] [address] [phone]                Discharge Exam: Cleavon Curls Weights   03/07/24 0408  Weight: 49.9 kg   General exam: Awake, laying in bed, in nad Respiratory system: Normal respiratory effort, no wheezing Cardiovascular system: regular rate, s1, s2 Gastrointestinal system: Soft, nondistended, positive BS Central nervous system: CN2-12 grossly intact, strength intact Extremities: Perfused, no clubbing Skin: Normal skin turgor, no notable skin lesions seen Psychiatry: Mood normal // no visual hallucinations   Condition at discharge: fair  The results of significant diagnostics from this hospitalization (including imaging, microbiology, ancillary and laboratory) are listed below for reference.   Imaging Studies: DG Chest Port 1 View Result Date: 03/07/2024 CLINICAL DATA:  18 year old female with possible sepsis. EXAM: PORTABLE CHEST 1 VIEW COMPARISON:  Chest and abdominal radiographs 12/12/2013 and earlier. FINDINGS: Portable AP view at 0544 hours. Mild to moderate dextroconvex scoliosis is new since 2015. Normal lung  volumes and mediastinal contours. Visualized tracheal air column is within normal limits. Allowing for portable technique the lungs are clear. No pneumothorax or pleural effusion. Multiple EKG leads and wires. No acute osseous abnormality identified. Paucity of bowel gas. IMPRESSION: Negative portable chest;  dextroconvex scoliosis. Electronically Signed   By: Marlise Simpers M.D.   On: 03/07/2024 06:15    Microbiology: Results for orders placed or performed during the hospital encounter of 03/07/24  Blood Culture (routine x 2)     Status: None (Preliminary result)   Collection Time: 03/07/24  4:31 AM   Specimen: BLOOD  Result Value Ref Range Status   Specimen Description BLOOD LEFT ANTECUBITAL  Final   Special Requests   Final    BOTTLES DRAWN AEROBIC AND ANAEROBIC Blood Culture results may not be optimal due to an inadequate volume of blood received in culture bottles   Culture   Final    NO GROWTH 2 DAYS Performed at [facility], [address], [place], [place] [address]  Report Status PENDING  Incomplete  Blood Culture (routine x 2)     Status: None (Preliminary result)   Collection Time: 03/07/24  4:36 AM   Specimen: BLOOD  Result Value Ref Range Status   Specimen Description BLOOD RIGHT ANTECUBITAL  Final   Special Requests   Final    BOTTLES DRAWN AEROBIC AND ANAEROBIC Blood Culture results may not be optimal due to an inadequate volume of blood received in culture bottles   Culture   Final    NO GROWTH 2 DAYS Performed at [facility], [address], [place], [place] [address]    Report Status PENDING  Incomplete    Labs: CBC: Recent Labs  Lab 03/07/24 0500 03/08/24 0958 03/09/24 0528  WBC 18.3* 20.7* 18.2*  NEUTROABS 8.1*  --   --   HGB 11.9* 11.8* 11.7*  HCT 35.7* 36.0 35.7*  MCV 90.2 90.9 90.4  PLT 209 189 189   Basic Metabolic Panel: Recent Labs  Lab 03/07/24 0500 03/08/24 0958 03/09/24 0528  NA 136 136 135  K 3.4* 4.5 4.3  CL 103 102 102   CO2 23 25 26   GLUCOSE 99 94 100*  BUN 10 5* 7  CREATININE 0.85 0.74 0.76  CALCIUM 8.8* 9.1 8.8*  MG  --  2.1  --   PHOS  --  3.7  --    Liver Function Tests: Recent Labs  Lab 03/07/24 0500 03/08/24 0958 03/09/24 0528  AST 364* 455* 436*  ALT 530* 610* 632*  ALKPHOS 192* 189* 197*  BILITOT 0.8 0.8 0.8  PROT 7.3 6.8 6.7  ALBUMIN 3.6 3.4* 3.2*   CBG: No results for input(s): "GLUCAP" in the last 168 hours.  Discharge time spent: less than 30 minutes.  Signed: Cherylle Corwin, MD [facility] 03/09/2024

## 2024-03-09 NOTE — Plan of Care (Signed)

## 2024-03-10 LAB — HIV ANTIBODY (ROUTINE TESTING W REFLEX): HIV Screen 4th Generation wRfx: NONREACTIVE

## 2024-03-12 LAB — CULTURE, BLOOD (ROUTINE X 2)
Culture: NO GROWTH
Culture: NO GROWTH

## 2024-03-13 ENCOUNTER — Other Ambulatory Visit (HOSPITAL_BASED_OUTPATIENT_CLINIC_OR_DEPARTMENT_OTHER): Payer: Self-pay

## 2024-03-13 MED ORDER — FLUOXETINE HCL 10 MG PO CAPS
10.0000 mg | ORAL_CAPSULE | Freq: Every day | ORAL | 2 refills | Status: AC
  Filled 2024-03-13: qty 90, 90d supply, fill #0

## 2024-06-05 ENCOUNTER — Other Ambulatory Visit (HOSPITAL_BASED_OUTPATIENT_CLINIC_OR_DEPARTMENT_OTHER): Payer: Self-pay

## 2024-06-05 DIAGNOSIS — F419 Anxiety disorder, unspecified: Secondary | ICD-10-CM | POA: Diagnosis not present

## 2024-06-05 DIAGNOSIS — Z0000 Encounter for general adult medical examination without abnormal findings: Secondary | ICD-10-CM | POA: Diagnosis not present

## 2024-06-05 DIAGNOSIS — Z113 Encounter for screening for infections with a predominantly sexual mode of transmission: Secondary | ICD-10-CM | POA: Diagnosis not present

## 2024-06-05 MED ORDER — FLUOXETINE HCL 10 MG PO CAPS
10.0000 mg | ORAL_CAPSULE | Freq: Every day | ORAL | 3 refills | Status: AC
  Filled 2024-06-05: qty 90, 90d supply, fill #0
  Filled 2024-09-23: qty 90, 90d supply, fill #1

## 2024-08-19 LAB — HIV-1/2 AB - DIFFERENTIATION
